# Patient Record
Sex: Male | Born: 1985 | Race: White | Hispanic: Yes | Marital: Married | Smoking: Never smoker
Health system: Southern US, Community
[De-identification: ages and names within clinical notes are randomized; demographics above are authoritative.]

## PROBLEM LIST (undated history)

## (undated) DIAGNOSIS — L209 Atopic dermatitis, unspecified: Secondary | ICD-10-CM

## (undated) HISTORY — DX: Atopic dermatitis, unspecified: L20.9

## (undated) HISTORY — PX: APPENDECTOMY: SHX54

---

## 2008-08-03 ENCOUNTER — Emergency Department (HOSPITAL_COMMUNITY): Admission: EM | Admit: 2008-08-03 | Discharge: 2008-08-04 | Payer: Self-pay | Admitting: Emergency Medicine

## 2008-11-09 ENCOUNTER — Emergency Department (HOSPITAL_COMMUNITY): Admission: EM | Admit: 2008-11-09 | Discharge: 2008-11-09 | Payer: Self-pay | Admitting: Emergency Medicine

## 2010-11-30 ENCOUNTER — Emergency Department (HOSPITAL_COMMUNITY)
Admission: EM | Admit: 2010-11-30 | Discharge: 2010-11-30 | Disposition: A | Discharge status: Home / Self Care | Payer: Self-pay | Attending: Emergency Medicine | Admitting: Emergency Medicine

## 2010-11-30 ENCOUNTER — Emergency Department (HOSPITAL_COMMUNITY): Payer: Self-pay

## 2010-11-30 DIAGNOSIS — L089 Local infection of the skin and subcutaneous tissue, unspecified: Secondary | ICD-10-CM | POA: Insufficient documentation

## 2010-11-30 DIAGNOSIS — M25569 Pain in unspecified knee: Secondary | ICD-10-CM | POA: Insufficient documentation

## 2010-11-30 DIAGNOSIS — M25469 Effusion, unspecified knee: Secondary | ICD-10-CM | POA: Insufficient documentation

## 2017-09-04 ENCOUNTER — Encounter (INDEPENDENT_AMBULATORY_CARE_PROVIDER_SITE_OTHER): Payer: Self-pay | Admitting: Nurse Practitioner

## 2017-09-04 ENCOUNTER — Other Ambulatory Visit: Payer: Self-pay

## 2017-09-04 ENCOUNTER — Ambulatory Visit (INDEPENDENT_AMBULATORY_CARE_PROVIDER_SITE_OTHER): Payer: Self-pay | Admitting: Nurse Practitioner

## 2017-09-04 VITALS — BP 134/89 | HR 94 | Temp 97.9°F | Ht 65.0 in | Wt 177.5 lb

## 2017-09-04 DIAGNOSIS — L290 Pruritus ani: Secondary | ICD-10-CM

## 2017-09-04 DIAGNOSIS — R1084 Generalized abdominal pain: Secondary | ICD-10-CM | POA: Insufficient documentation

## 2017-09-04 MED ORDER — HYDROCORTISONE 1 % EX CREA: 1.0000 "application " | TOPICAL_CREAM | Freq: Two times a day (BID) | CUTANEOUS | 1 refills | Status: DC

## 2017-09-04 MED ORDER — ZINC OXIDE 10 % EX CREA: 1.0000 "application " | TOPICAL_CREAM | Freq: Two times a day (BID) | CUTANEOUS | 1 refills | Status: DC

## 2017-09-04 NOTE — Progress Notes (Signed)
Assessment & Plan:  Theodore May was seen today for new patient (initial visit).  Diagnoses and all orders for this visit:  Perianal itch -     hydrocortisone cream 1 %; Apply 1 application topically 2 (two) times daily. -     ZINC OXIDE, TOPICAL, 10 % CREA; Apply 1 application topically 2 (two) times daily. Handout given on perianal care.    Patient has been counseled on age-appropriate routine health concerns for screening and prevention. These are reviewed and up-to-date. Referrals have been placed accordingly. Immunizations are up-to-date or declined.    Subjective:   Chief Complaint  Patient presents with  . New Patient (Initial Visit)    pain in stomach and anus when using the bathroom    HPI Theodore May 32 y.o. male presents to office today to establish care. VRI was used to communicate directly with patient for the entire encounter including providing detailed patient instructions.   Perianal Itching Endorses anal itching after having bowel movements and also when he sweats he experiences itching in his rectum. He also has noticed a small amount of blood on the toilet paper after having a bowel movement. He denies constipation, melena or hematochezia.  Stools are normal. He has not tried any creams. Onset 3 weeks ago. He denies any abdominal pain.    Review of Systems  Constitutional: Negative for fever, malaise/fatigue and weight loss.  Eyes: Negative.  Negative for blurred vision, double vision and photophobia.  Respiratory: Negative.  Negative for cough and shortness of breath.   Cardiovascular: Negative.  Negative for chest pain, palpitations and leg swelling.  Gastrointestinal: Negative for abdominal pain, blood in stool, constipation, diarrhea, heartburn, melena, nausea and vomiting.       SEE HPI  Skin: Positive for itching.       History of atopic dermatitis  Neurological: Negative.  Negative for dizziness, focal weakness, seizures and headaches.   Psychiatric/Behavioral: Negative.  Negative for suicidal ideas.    Past Medical History:  Diagnosis Date  . Atopic dermatitis     Past Surgical History:  Procedure Laterality Date  . APPENDECTOMY      Family History  Problem Relation Age of Onset  . Diabetes Mother   . Diabetes Father     Social History Reviewed with no changes to be made today.   No outpatient medications prior to visit.   No facility-administered medications prior to visit.     Not on File     Objective:    BP 134/89 (BP Location: Left Arm, Patient Position: Sitting, Cuff Size: Normal)   Pulse 94   Temp 97.9 F (36.6 C) (Oral)   Ht 5\' 5"  (1.651 m)   Wt 177 lb 8 oz (80.5 kg)   SpO2 95%   BMI 29.54 kg/m  Wt Readings from Last 3 Encounters:  09/04/17 177 lb 8 oz (80.5 kg)    Physical Exam  Constitutional: He is oriented to person, place, and time. He appears well-developed and well-nourished. He is cooperative.  HENT:  Head: Normocephalic and atraumatic.  Cardiovascular: Normal rate, regular rhythm, normal heart sounds and intact distal pulses. Exam reveals no gallop and no friction rub.  No murmur heard. Pulmonary/Chest: Effort normal and breath sounds normal. No stridor. No tachypnea. No respiratory distress. He has no decreased breath sounds. He has no wheezes. He has no rhonchi. He has no rales. He exhibits no tenderness.  Abdominal: Soft. Bowel sounds are normal. He exhibits no distension and no mass. There  is no tenderness. There is no rebound and no guarding.  Genitourinary: Rectum normal and penis normal. Rectal exam shows no external hemorrhoid, no fissure, no mass, no tenderness and anal tone normal.  Musculoskeletal: Normal range of motion. He exhibits no edema.  Neurological: He is alert and oriented to person, place, and time. Coordination normal.  Skin: Skin is warm and dry.  Psychiatric: He has a normal mood and affect. His behavior is normal. Judgment and thought content  normal.  Nursing note and vitals reviewed.      Patient has been counseled extensively about nutrition and exercise as well as the importance of adherence with medications and regular follow-up. The patient was given clear instructions to go to ER or return to medical center if symptoms don't improve, worsen or new problems develop. The patient verbalized understanding.   Follow-up: Return in about 3 weeks (around 09/25/2017) for f/u perianal itching.   Claiborne RiggZelda W Haiden Rawlinson, FNP-BC [facility] [place], [place] [phone]   09/04/2017, 2:52 PM

## 2017-09-04 NOTE — Patient Instructions (Addendum)
  Prurito anal (Anal Pruritus) El prurito anal ocurre cuando siente picazn en la zona que rodea el ano. Esta sensacin de picazn suele aparecer cuando la zona est hmeda. La humedad puede deberse a sudor o a una pequea cantidad de materia fecal (heces) que queda en la zona. Si se rasca la zona afectada puede daarla ms. CUIDADOS EN EL HOGAR Est atento a cualquier cambio en la afeccin. Tome estas medidas para Technical sales engineeraliviar la picazn: Cuidado de la piel  Mantenga la higiene lavndose bien el cuerpo. ? Limpie la zona afectada con papel higinico, toallitas para bebs o un pao hmedo. Hgalo despus de defecar y al acostarse. ? No use jabones en la zona. ? Seque bien la zona. Squela dando golpecitos.  No frote la zona con ningn elemento, ni siquiera con papel higinico.  No se rasque la zona que le pica, esto puede intensificar la picazn.  Dese un bao de agua tibia (bao de asiento) como se lo haya indicado el mdico. Seque la zona con un pao suave, dando golpecitos.  Use cremas o ungentos como se lo haya indicado el mdico.  No tome baos de burbujas, ni use papel higinico perfumado o desodorantes ntimos. Instrucciones generales  [employer] los medicamentos de venta libre y los recetados solamente como se lo haya indicado el mdico.  Hable con el mdico sobre los comprimidos de fibra (suplementos).  Use ropa interior de algodn y prendas sueltas.  Concurra a todas las visitas de control como se lo haya indicado el mdico. Esto es importante. SOLICITE AYUDA SI:  La picazn no se alivia despus de Rite Aidmuchos das.  La picazn aumenta.  Tiene fiebre.  La zona que le pica est enrojecida, se le hincha o le duele.  Observa que emana lquido, sangre o pus de la zona que le pica. Esta informacin no tiene Theme park managercomo fin reemplazar el consejo del mdico. Asegrese de hacerle al mdico cualquier pregunta que tenga. Document Released: 01/16/2011 Document Revised: 01/25/2015 Document Reviewed:  08/01/2014 Elsevier Interactive Patient Education  [employer]2018 Elsevier Inc.

## 2017-10-01 ENCOUNTER — Ambulatory Visit (INDEPENDENT_AMBULATORY_CARE_PROVIDER_SITE_OTHER): Payer: Self-pay | Admitting: Nurse Practitioner

## 2017-10-29 ENCOUNTER — Other Ambulatory Visit: Payer: Self-pay

## 2017-10-29 ENCOUNTER — Ambulatory Visit (INDEPENDENT_AMBULATORY_CARE_PROVIDER_SITE_OTHER): Payer: Self-pay | Admitting: Physician Assistant

## 2017-10-29 ENCOUNTER — Encounter (INDEPENDENT_AMBULATORY_CARE_PROVIDER_SITE_OTHER): Payer: Self-pay | Admitting: Physician Assistant

## 2017-10-29 VITALS — BP 127/74 | HR 67 | Temp 97.9°F | Ht 65.0 in | Wt 176.2 lb

## 2017-10-29 DIAGNOSIS — Z23 Encounter for immunization: Secondary | ICD-10-CM

## 2017-10-29 DIAGNOSIS — Z0000 Encounter for general adult medical examination without abnormal findings: Secondary | ICD-10-CM

## 2017-10-29 DIAGNOSIS — Z114 Encounter for screening for human immunodeficiency virus [HIV]: Secondary | ICD-10-CM

## 2017-10-29 NOTE — Patient Instructions (Signed)
Vacuna Td (contra la difteria y el ttanos): Lo que debe saber (Td Vaccine [Tetanus and Diphtheria]: What You Need to Know) 1. Por qu vacunarse? El ttanos y la difteria son enfermedades muy graves. Son poco frecuentes en los [place] actualmente, pero las personas que se infectan suelen tener complicaciones graves. La vacuna Td se usa para proteger a los adolescentes y a los adultos de ambas enfermedades. Tanto el ttanos como la difteria son infecciones causadas por bacterias. La difteria se transmite de persona a persona a travs de la tos o el estornudo. La bacteria que causa el ttanos entra al cuerpo a travs de cortes, raspones o heridas. El TTANOS (trismo) provoca entumecimiento y contraccin dolorosa de los msculos, por lo general, en todo el cuerpo.  Puede causar el endurecimiento de los msculos de la cabeza y el cuello, de modo que impide abrir la boca, tragar y en algunos casos, respirar. El ttanos es causa de muerte en aproximadamente 1de cada 10personas que contraen la infeccin, incluso despus de que reciben la mejor atencin mdica. La DIFTERIA puede hacer que se forme una membrana gruesa en la parte posterior de la garganta.  Puede causar problemas respiratorios, parlisis, insuficiencia cardaca e incluso la muerte. Antes de las vacunas, en los [place] se informaban 200000 casos de difteria y cientos de casos de ttanos cada ao. Desde que comenz la vacunacin, los informes de casos de ambas enfermedades se han reducido en un 99%. 2. Vacuna Td La vacuna Td protege a adolescentes y adultos contra el ttanos y la difteria. La vacuna Td habitualmente se aplica como dosis de refuerzo cada 10aos, pero tambin puede administrarse antes si la persona sufre una quemadura o herida sucia y grave. A veces, en lugar de la vacuna Td, se recomienda una vacuna llamada Tdap, que protege contra la tosferina, adems de proteger contra el ttanos y la difteria. El mdico o la  persona que le aplique la vacuna puede darle ms informacin al respecto. La Td puede administrarse de manera segura simultneamente con otras vacunas. 3. Algunas personas no deben recibir la vacuna  Una persona que alguna vez ha tenido una reaccin alrgica potencialmente mortal a una dosis anterior de cualquier vacuna contra el ttanos o la difteria, O que tenga una alergia grave a cualquier parte de esta vacuna, no debe recibir la vacuna Td. Informe a la persona que le aplica la vacuna si usted tiene cualquier alergia grave.  Consulte con su mdico si: ? tuvo hinchazn o dolor intenso despus de recibir cualquier vacuna contra la difteria o el ttanos, ? alguna vez ha sufrido el sndrome de Guillain-Barr, ? no se siente bien el da en que se ha programado la vacuna.  4. Riesgos de una reaccin a la vacuna Con cualquier medicamento, incluyendo las vacunas, existe la posibilidad de que aparezcan efectos secundarios. Suelen ser leves y desaparecen por s solos. Tambin son posibles las reacciones graves, pero en raras ocasiones. La mayora de las personas a las que se les aplica la vacuna Td no tienen ningn problema. Problemas leves despus de la vacuna Td: (No interfirieron en otras actividades)  Dolor en el lugar donde se aplic la vacuna (alrededor de 8de cada 10personas)  Enrojecimiento o hinchazn en el lugar donde se aplic la vacuna (alrededor de 1de cada 4personas)  Fiebre leve (poco frecuente)  Dolor de cabeza (alrededor de 1de cada 4personas)  Cansancio (alrededor de 1de cada 4personas) Problemas moderados despus de la vacuna Td: (Interfirieron en otras actividades,   pero no requirieron atencin mdica)  Fiebre superior a 102F (38,8C) (poco frecuente) Problemas graves despus de la vacuna Td: (Impidieron realizar las actividades habituales; requirieron atencin mdica)  Hinchazn, dolor intenso, sangrado o enrojecimiento en el brazo en que se aplic la vacuna  (poco frecuente). Problemas que podran ocurrir despus de cualquier vacuna:  Las personas a veces se desmayan despus de un procedimiento mdico, incluida la vacunacin. Si permanece sentado o recostado durante 15 minutos puede ayudar a evitar los desmayos y las lesiones causadas por las cadas. Informe al mdico si se siente mareado, tiene cambios en la visin o zumbidos en los odos.  Algunas personas sienten un dolor intenso en el hombro y tienen dificultad para mover el brazo donde se coloc la vacuna. Esto sucede con muy poca frecuencia.  Cualquier medicamento puede causar una reaccin alrgica grave. Dichas reacciones son muy poco frecuentes con una vacuna (se calcula que menos de 1en un milln de dosis) y se producen de unos minutos a unas horas despus de la administracin. Al igual que con cualquier medicamento, existe una probabilidad muy remota de que una vacuna cause una lesin grave o la muerte. Se controla permanentemente la seguridad de las vacunas. Para obtener ms informacin, visite: www.cdc.gov/vaccinesafety/. 5. Qu pasa si hay una reaccin grave? A qu signos debo estar atento?  Observe todo lo que le preocupe, como signos de una reaccin alrgica grave, fiebre muy alta o comportamiento fuera de lo normal. Los signos de una reaccin alrgica grave pueden incluir ronchas, hinchazn de la cara y la garganta, dificultad para respirar, latidos cardacos acelerados, mareos y debilidad. Generalmente, estos comenzaran entre unos pocos minutos y algunas horas despus de la vacunacin. Qu debo hacer?  Si usted piensa que se trata de una reaccin alrgica grave o de otra emergencia que no puede esperar, llame al 911 o dirjase al hospital ms cercano. Sino, llame a su mdico.  Despus, la reaccin debe informarse al Sistema de Informacin sobre Efectos Adversos de las Vacunas (Vaccine Adverse Event Reporting System, VAERS). Su mdico puede presentar este informe, o puede hacerlo  usted mismo a travs del sitio web de VAERS, en www.vaers.hhs.gov, o llamando al 1-800-822-7967. VAERS no brinda recomendaciones mdicas. 6. Programa Nacional de Compensacin de Daos por Vacunas El Programa Nacional de Compensacin de Daos por Vacunas (National Vaccine Injury Compensation Program, VICP) es un programa federal que fue creado para compensar a las personas que puedan haber sufrido daos al recibir ciertas vacunas. Aquellas personas que consideren que han sufrido un dao como consecuencia de una vacuna y quieran saber ms acerca del programa y de cmo presentar una denuncia, pueden llamar al 1-800-338-2382 o visitar su sitio web en www.hrsa.gov/vaccinecompensation. Hay un lmite de tiempo para presentar un reclamo de compensacin. 7. Cmo puedo obtener ms informacin?  Consulte a su mdico. Este puede darle el prospecto de la vacuna o recomendarle otras fuentes de informacin.  Comunquese con el servicio de salud de su localidad o su estado.  Comunquese con los Centros para el Control y la Prevencin de Enfermedades (Centers for Disease Control and Prevention , CDC). ? Llame al 1-800-232-4636 (1-800-CDC-INFO). ? Visite el sitio web de los CDC en www.cdc.gov/vaccines. Declaracin de informacin sobre la vacuna contra la difteria y el ttanos (Td) de los CDC (08/29/15) Esta informacin no tiene como fin reemplazar el consejo del mdico. Asegrese de hacerle al mdico cualquier pregunta que tenga. Document Released: 08/22/2008 Document Revised: 05/27/2014 Document Reviewed: 08/29/2015 Elsevier Interactive Patient Education  2017 Elsevier   Inc.  

## 2017-10-29 NOTE — Progress Notes (Signed)
   Subjective:  Patient ID: Theodore May, male    DOB: 1986/02/28  Age: 32 y.o. MRN: [id number]  CC: perianal itching  HPI Theodore May is a 32 y.o. male with a medical history of atopic dermatitis presents on f/u of perianal itching. Seen here two months ago and prescribed Hdyrocortisone 1% cream and zinc oxide 10% cream which reportedly have resolved the issue. Believes the itching May have been caused by heat and sweating. Pt interested in having a full physical done today. Does not have any complaints or symptoms. Would like blood work because he did have abnormal cholesterol levels previously.        Outpatient Medications Prior to Visit  Medication Sig Dispense Refill  . hydrocortisone cream 1 % Apply 1 application topically 2 (two) times daily. 45 g 1  . ZINC OXIDE, TOPICAL, 10 % CREA Apply 1 application topically 2 (two) times daily. 78 g 1   No facility-administered medications prior to visit.      ROS Review of Systems  Constitutional: Negative for chills, fever and malaise/fatigue.  Eyes: Negative for blurred vision.  Respiratory: Negative for shortness of breath.   Cardiovascular: Negative for chest pain and palpitations.  Gastrointestinal: Negative for abdominal pain and nausea.  Genitourinary: Negative for dysuria and hematuria.  Musculoskeletal: Negative for joint pain and myalgias.  Skin: Negative for rash.  Neurological: Negative for tingling and headaches.  Psychiatric/Behavioral: Negative for depression. The patient is not nervous/anxious.     Objective:  BP 127/74 (BP Location: Left Arm, Patient Position: Sitting, Cuff Size: Normal)   Pulse 67   Temp 97.9 F (36.6 C) (Oral)   Ht 5\' 5"  (1.651 m)   Wt 176 lb 3.2 oz (79.9 kg)   SpO2 97%   BMI 29.32 kg/m   BP/Weight 10/29/2017 09/04/2017  Systolic BP 127 134  Diastolic BP 74 89  Wt. (Lbs) 176.2 177.5  BMI 29.32 29.54      Physical Exam  Constitutional: He is oriented to person,  place, and time.  Well developed, well nourished, NAD, polite  HENT:  Head: Normocephalic and atraumatic.  Mouth/Throat: No oropharyngeal exudate.  Eyes: Pupils are equal, round, and reactive to light. Conjunctivae and EOM are normal. No scleral icterus.  Neck: Normal range of motion. Neck supple. No thyromegaly present.  Cardiovascular: Normal rate, regular rhythm and normal heart sounds. Exam reveals no gallop and no friction rub.  No murmur heard. Pulmonary/Chest: Effort normal and breath sounds normal.  Abdominal: Soft. Bowel sounds are normal. There is no tenderness.  Musculoskeletal: He exhibits no edema, tenderness or deformity.  Back and extremities with full aROM.  Neurological: He is alert and oriented to person, place, and time.  Patellar DTR 1+ bilaterally. Strength 5/5 throughout  Skin: Skin is warm and dry. No rash noted. No erythema. No pallor.  Psychiatric: He has a normal mood and affect. His behavior is normal. Thought content normal.  Vitals reviewed.    Assessment & Plan:    1. Annual physical exam - CBC with Differential - Comprehensive metabolic panel - Lipid panel  2. Screening for HIV (human immunodeficiency virus) - HIV antibody  3. Need for Tdap vaccination - Tdap vaccine greater than or equal to 7yo IM     Follow-up: Return if symptoms worsen or fail to improve.   Loletta Specteroger David Gomez PA

## 2017-10-30 LAB — CBC WITH DIFFERENTIAL/PLATELET
Basophils Absolute: 0 10*3/uL (ref 0.0–0.2)
Basos: 0 %
EOS (ABSOLUTE): 0.1 10*3/uL (ref 0.0–0.4)
EOS: 1 %
HEMATOCRIT: 51.4 % — AB (ref 37.5–51.0)
HEMOGLOBIN: 17.9 g/dL — AB (ref 13.0–17.7)
IMMATURE GRANS (ABS): 0 10*3/uL (ref 0.0–0.1)
IMMATURE GRANULOCYTES: 0 %
LYMPHS ABS: 1.8 10*3/uL (ref 0.7–3.1)
Lymphs: 32 %
MCH: 31.1 pg (ref 26.6–33.0)
MCHC: 34.8 g/dL (ref 31.5–35.7)
MCV: 89 fL (ref 79–97)
MONOCYTES: 10 %
Monocytes Absolute: 0.6 10*3/uL (ref 0.1–0.9)
Neutrophils Absolute: 3.2 10*3/uL (ref 1.4–7.0)
Neutrophils: 57 %
Platelets: 225 10*3/uL (ref 150–450)
RBC: 5.75 x10E6/uL (ref 4.14–5.80)
RDW: 13.6 % (ref 12.3–15.4)
WBC: 5.7 10*3/uL (ref 3.4–10.8)

## 2017-10-30 LAB — COMPREHENSIVE METABOLIC PANEL
ALBUMIN: 4.7 g/dL (ref 3.5–5.5)
ALT: 86 IU/L — ABNORMAL HIGH (ref 0–44)
AST: 45 IU/L — ABNORMAL HIGH (ref 0–40)
Albumin/Globulin Ratio: 1.7 (ref 1.2–2.2)
Alkaline Phosphatase: 60 IU/L (ref 39–117)
BUN / CREAT RATIO: 18 (ref 9–20)
BUN: 13 mg/dL (ref 6–20)
Bilirubin Total: 0.7 mg/dL (ref 0.0–1.2)
CALCIUM: 10 mg/dL (ref 8.7–10.2)
CO2: 24 mmol/L (ref 20–29)
CREATININE: 0.73 mg/dL — AB (ref 0.76–1.27)
Chloride: 100 mmol/L (ref 96–106)
GFR, EST AFRICAN AMERICAN: 143 mL/min/{1.73_m2} (ref >59)
GFR, EST NON AFRICAN AMERICAN: 124 mL/min/{1.73_m2} (ref >59)
Globulin, Total: 2.7 g/dL (ref 1.5–4.5)
Glucose: 98 mg/dL (ref 65–99)
Potassium: 4.1 mmol/L (ref 3.5–5.2)
SODIUM: 139 mmol/L (ref 134–144)
TOTAL PROTEIN: 7.4 g/dL (ref 6.0–8.5)

## 2017-10-30 LAB — LIPID PANEL
CHOL/HDL RATIO: 4.7 ratio (ref 0.0–5.0)
Cholesterol, Total: 177 mg/dL (ref 100–199)
HDL: 38 mg/dL — ABNORMAL LOW (ref >39)
LDL CALC: 110 mg/dL — AB (ref 0–99)
Triglycerides: 144 mg/dL (ref 0–149)
VLDL CHOLESTEROL CAL: 29 mg/dL (ref 5–40)

## 2017-10-30 LAB — HIV ANTIBODY (ROUTINE TESTING W REFLEX): HIV Screen 4th Generation wRfx: NONREACTIVE

## 2017-10-31 ENCOUNTER — Telehealth (INDEPENDENT_AMBULATORY_CARE_PROVIDER_SITE_OTHER): Payer: Self-pay

## 2017-10-31 NOTE — Telephone Encounter (Signed)
-----   Message from Loletta Specteroger David Gomez, PA-C sent at 10/30/2017  8:47 AM EDT ----- HIV negative, mild liver inflammation, mildly elevated cholesterol. I recommend he reduce his fatty/fried food intake. Watch sweet intake. I would be able to work up liver enzyme elevation at his next visit.

## 2017-10-31 NOTE — Telephone Encounter (Signed)
[employer] interpreter [place] [phone]) left message asking patient to call [facility] at [phone]. If patient returns call notify that HIV is negative. Mild liver inflammation, mildly elevated cholesterol. It is recommended that patient reduce his fatty/fried food intake, watch sweet intake. Will further work up liver elevation at next visit. Maryjean Mornempestt S Srinidhi Landers, CMA

## 2017-12-17 ENCOUNTER — Other Ambulatory Visit: Payer: Self-pay

## 2017-12-17 ENCOUNTER — Ambulatory Visit (INDEPENDENT_AMBULATORY_CARE_PROVIDER_SITE_OTHER): Payer: Self-pay | Admitting: Physician Assistant

## 2017-12-17 ENCOUNTER — Encounter (INDEPENDENT_AMBULATORY_CARE_PROVIDER_SITE_OTHER): Payer: Self-pay | Admitting: Physician Assistant

## 2017-12-17 ENCOUNTER — Other Ambulatory Visit (HOSPITAL_COMMUNITY)
Admission: RE | Admit: 2017-12-17 | Discharge: 2017-12-17 | Disposition: A | Discharge status: Home / Self Care | Payer: Self-pay | Source: Ambulatory Visit | Attending: Physician Assistant | Admitting: Physician Assistant

## 2017-12-17 VITALS — BP 113/75 | HR 70 | Temp 98.1°F | Ht 65.0 in | Wt 178.4 lb

## 2017-12-17 DIAGNOSIS — Z131 Encounter for screening for diabetes mellitus: Secondary | ICD-10-CM

## 2017-12-17 DIAGNOSIS — G8929 Other chronic pain: Secondary | ICD-10-CM

## 2017-12-17 DIAGNOSIS — M545 Low back pain: Secondary | ICD-10-CM

## 2017-12-17 DIAGNOSIS — Z202 Contact with and (suspected) exposure to infections with a predominantly sexual mode of transmission: Secondary | ICD-10-CM

## 2017-12-17 DIAGNOSIS — R350 Frequency of micturition: Secondary | ICD-10-CM | POA: Insufficient documentation

## 2017-12-17 DIAGNOSIS — R748 Abnormal levels of other serum enzymes: Secondary | ICD-10-CM

## 2017-12-17 LAB — POCT GLYCOSYLATED HEMOGLOBIN (HGB A1C): Hemoglobin A1C: 5.3 % (ref 4.0–5.6)

## 2017-12-17 LAB — POCT URINALYSIS DIPSTICK
BILIRUBIN UA: NEGATIVE
Blood, UA: NEGATIVE
GLUCOSE UA: NEGATIVE
Ketones, UA: NEGATIVE
Leukocytes, UA: NEGATIVE
Nitrite, UA: NEGATIVE
Protein, UA: NEGATIVE
Spec Grav, UA: 1.025 (ref 1.010–1.025)
Urobilinogen, UA: 0.2 E.U./dL
pH, UA: 5.5 (ref 5.0–8.0)

## 2017-12-17 MED ORDER — CYCLOBENZAPRINE HCL 10 MG PO TABS: 10.0000 mg | ORAL_TABLET | Freq: Every day | ORAL | 0 refills | Status: DC

## 2017-12-17 MED ORDER — NAPROXEN 500 MG PO TABS: 500.0000 mg | ORAL_TABLET | Freq: Two times a day (BID) | ORAL | 0 refills | Status: DC

## 2017-12-17 NOTE — Patient Instructions (Signed)

## 2017-12-17 NOTE — Progress Notes (Signed)
Subjective:  Patient ID: Theodore May, male    DOB: [birth year]  Age: 32 y.o. MRN: [id number]  CC: abdominal pain left side  HPI  Theodore May is a 32 y.o. male with a medical history of atopic dermatitis presents with abdominal pain on the left side. Thinks his liver is in the LUQ and is now hurting after hearing of mildly elevated liver enzymes. Says he also has bilateral lower back pain. Attributed to driving long hours. Has associated urinary frequency. Admits to having protected sexual intercourse outside of his marriage with a male friend. Does not endorse f/c/n/v, rash, genital lesions, or penile drainage.     Pt here to f/u on elevated liver enzymes. AST 45 and ALT 86. Does not drink alcohol. States he has also changed diet to include less fatty foods due to his elevated LDL of 110.          Outpatient Medications Prior to Visit  Medication Sig Dispense Refill  . hydrocortisone cream 1 % Apply 1 application topically 2 (two) times daily. 45 g 1  . ZINC OXIDE, TOPICAL, 10 % CREA Apply 1 application topically 2 (two) times daily. 78 g 1   No facility-administered medications prior to visit.      ROS Review of Systems  Constitutional: Negative for chills, fever and malaise/fatigue.  Eyes: Negative for blurred vision.  Respiratory: Negative for shortness of breath.   Cardiovascular: Negative for chest pain and palpitations.  Gastrointestinal: Positive for abdominal pain (LUQ). Negative for nausea.  Genitourinary: Negative for dysuria and hematuria.  Musculoskeletal: Positive for back pain. Negative for joint pain and myalgias.  Skin: Negative for rash.  Neurological: Negative for tingling and headaches.  Psychiatric/Behavioral: Negative for depression. The patient is not nervous/anxious.     Objective:  BP 113/75 (BP Location: Left Arm, Patient Position: Sitting, Cuff Size: Normal)   Pulse 70   Temp 98.1 F (36.7 C) (Oral)   Ht 5\' 5"  (1.651 m)   Wt  178 lb 6.4 oz (80.9 kg)   SpO2 94%   BMI 29.69 kg/m   BP/Weight 12/17/2017 10/29/2017 09/04/2017  Systolic BP 113 127 134  Diastolic BP 75 74 89  Wt. (Lbs) 178.4 176.2 177.5  BMI 29.69 29.32 29.54      Physical Exam  Constitutional: He is oriented to person, place, and time.  Well developed, well nourished, NAD, polite  HENT:  Head: Normocephalic and atraumatic.  Eyes: No scleral icterus.  Neck: Normal range of motion. Neck supple. No thyromegaly present.  Cardiovascular: Normal rate, regular rhythm and normal heart sounds.  Pulmonary/Chest: Effort normal and breath sounds normal.  Abdominal: Soft. Bowel sounds are normal. He exhibits no distension and no mass. There is tenderness (mild TTP of the LUQ). There is no rebound and no guarding.  Genitourinary:  Genitourinary Comments: deferred  Musculoskeletal: He exhibits no edema.  Increased muscular tonicity of the lower thoracic and lumbar paraspinals  Neurological: He is alert and oriented to person, place, and time.  Skin: Skin is warm and dry. No rash noted. No erythema. No pallor.  Psychiatric: He has a normal mood and affect. His behavior is normal. Thought content normal.  Vitals reviewed.    Assessment & Plan:   1. Chronic bilateral low back pain without sciatica - Begin cyclobenzaprine (FLEXERIL) 10 MG tablet; Take 1 tablet (10 mg total) by mouth at bedtime.  Dispense: 10 tablet; Refill: 0 - Begin naproxen (NAPROSYN) 500 MG tablet; Take 1 tablet (500  mg total) by mouth 2 (two) times daily with a meal.  Dispense: 30 tablet; Refill: 0  2. Elevated liver enzymes - Hepatic Function Panel - Hepatitis panel, acute - US Abdomen Limited RUQ; Future  3. Screening for diabetes mellitus - HgB A1c 5.3%  4. Urinary frequency - Urinalysis Dipstick negative - Urine cytology ancillary only - HgB A1c 5.3%  5. Possible exposure to STD - Urine cytology ancillary only - HIV antibody   Meds ordered this encounter   Medications  . cyclobenzaprine (FLEXERIL) 10 MG tablet    Sig: Take 1 tablet (10 mg total) by mouth at bedtime.    Dispense:  10 tablet    Refill:  0    Order Specific Question:   Supervising Provider    Answer:   Hoy RegisterNEWLIN, ENOBONG [[id number]]  . naproxen (NAPROSYN) 500 MG tablet    Sig: Take 1 tablet (500 mg total) by mouth 2 (two) times daily with a meal.    Dispense:  30 tablet    Refill:  0    Order Specific Question:   Supervising Provider    Answer:   Hoy RegisterNEWLIN, ENOBONG [[id number]]    Follow-up: Return in about 4 months (around 04/18/2018) for LFTs.   Loletta Specteroger David Steffani Dionisio PA

## 2017-12-18 LAB — HEPATITIS PANEL, ACUTE
HEP A IGM: NEGATIVE
Hep B C IgM: NEGATIVE
Hep C Virus Ab: <0.1 s/co ratio (ref 0.0–0.9)
Hepatitis B Surface Ag: NEGATIVE

## 2017-12-18 LAB — HEPATIC FUNCTION PANEL
ALK PHOS: 60 IU/L (ref 39–117)
ALT: 115 IU/L — ABNORMAL HIGH (ref 0–44)
AST: 47 IU/L — ABNORMAL HIGH (ref 0–40)
Albumin: 4.8 g/dL (ref 3.5–5.5)
BILIRUBIN TOTAL: 0.7 mg/dL (ref 0.0–1.2)
BILIRUBIN, DIRECT: 0.16 mg/dL (ref 0.00–0.40)
Total Protein: 7.3 g/dL (ref 6.0–8.5)

## 2017-12-18 LAB — HIV ANTIBODY (ROUTINE TESTING W REFLEX): HIV Screen 4th Generation wRfx: NONREACTIVE

## 2017-12-19 ENCOUNTER — Ambulatory Visit (HOSPITAL_COMMUNITY)
Admission: RE | Admit: 2017-12-19 | Discharge: 2017-12-19 | Disposition: A | Discharge status: Home / Self Care | Payer: Self-pay | Source: Ambulatory Visit | Attending: Physician Assistant | Admitting: Physician Assistant

## 2017-12-19 ENCOUNTER — Telehealth (INDEPENDENT_AMBULATORY_CARE_PROVIDER_SITE_OTHER): Payer: Self-pay

## 2017-12-19 DIAGNOSIS — R748 Abnormal levels of other serum enzymes: Secondary | ICD-10-CM | POA: Insufficient documentation

## 2017-12-19 DIAGNOSIS — K760 Fatty (change of) liver, not elsewhere classified: Secondary | ICD-10-CM | POA: Insufficient documentation

## 2017-12-19 LAB — URINE CYTOLOGY ANCILLARY ONLY
Chlamydia: NEGATIVE
NEISSERIA GONORRHEA: NEGATIVE
TRICH (WINDOWPATH): NEGATIVE

## 2017-12-19 NOTE — Telephone Encounter (Signed)
Call placed using pacific interpreter [phone]. Patient does not have voicemail setup. Will call once more before mailing results. Maryjean Mornempestt S Roberts, CMA

## 2017-12-19 NOTE — Telephone Encounter (Signed)
-----   Message from Loletta Specteroger David Gomez, PA-C sent at 12/18/2017  8:33 AM EDT ----- Liver inflammation mildly higher than previous. Hepatitis negative and HIV negative. Awaiting RUQ US report. I suspect fatty liver, advise to avoid fatty/fried foods.

## 2017-12-26 ENCOUNTER — Telehealth (INDEPENDENT_AMBULATORY_CARE_PROVIDER_SITE_OTHER): Payer: Self-pay

## 2017-12-26 ENCOUNTER — Encounter (INDEPENDENT_AMBULATORY_CARE_PROVIDER_SITE_OTHER): Payer: Self-pay

## 2017-12-26 NOTE — Telephone Encounter (Signed)
Call placed using pacific interpreter [phone] patient voicemail still not setup. All results have been mailed. Maryjean Mornempestt S Roberts, CMA

## 2017-12-26 NOTE — Telephone Encounter (Signed)
-----   Message from Loletta Specteroger David Gomez, PA-C sent at 12/22/2017  1:40 PM EDT ----- US reveals fatty liver.

## 2018-04-15 ENCOUNTER — Ambulatory Visit (INDEPENDENT_AMBULATORY_CARE_PROVIDER_SITE_OTHER): Payer: Self-pay | Admitting: Physician Assistant

## 2018-05-01 ENCOUNTER — Other Ambulatory Visit: Payer: Self-pay

## 2018-05-01 ENCOUNTER — Ambulatory Visit (INDEPENDENT_AMBULATORY_CARE_PROVIDER_SITE_OTHER): Payer: Self-pay | Admitting: Physician Assistant

## 2018-05-01 ENCOUNTER — Encounter (INDEPENDENT_AMBULATORY_CARE_PROVIDER_SITE_OTHER): Payer: Self-pay | Admitting: Physician Assistant

## 2018-05-01 VITALS — BP 117/80 | HR 68 | Temp 97.7°F | Ht 65.0 in | Wt 185.2 lb

## 2018-05-01 DIAGNOSIS — R748 Abnormal levels of other serum enzymes: Secondary | ICD-10-CM | POA: Insufficient documentation

## 2018-05-01 DIAGNOSIS — E7841 Elevated Lipoprotein(a): Secondary | ICD-10-CM | POA: Insufficient documentation

## 2018-05-01 NOTE — Progress Notes (Signed)
Subjective:  Patient ID: Theodore May, male    DOB: 1986-01-06  Age: 32 y.o. MRN: [id number]  CC: f/u LFTs   HPI Theodore May a 32 y.o.malewith amedical history of atopic dermatitis presents to f/u on LFTs. Last AST 47 and ALT 115. Negative acute hepatitis panel. Abdominal US revealed fatty liver. Lipid panel revealed LDL 110 and triglycerides 409. Says he has tried adjusting diet to consume less fatty foods. Pt states he is feeling abdominal bloating and has much gas. Attributed to eating chinese food. Had an episode of nausea yesterday attributed to eating a late lunch. However, feels better in regards to LUQ pain. Does not endorse CP, palpitations, SOB, HA, abdominal pain, f/c/n/v, rash, jaundice, or GU sxs.       Outpatient Medications Prior to Visit  Medication Sig Dispense Refill  . hydrocortisone cream 1 % Apply 1 application topically 2 (two) times daily. (Patient not taking: Reported on 05/01/2018) 45 g 1  . ZINC OXIDE, TOPICAL, 10 % CREA Apply 1 application topically 2 (two) times daily. (Patient not taking: Reported on 05/01/2018) 78 g 1  . cyclobenzaprine (FLEXERIL) 10 MG tablet Take 1 tablet (10 mg total) by mouth at bedtime. 10 tablet 0  . naproxen (NAPROSYN) 500 MG tablet Take 1 tablet (500 mg total) by mouth 2 (two) times daily with a meal. 30 tablet 0   No facility-administered medications prior to visit.      ROS Review of Systems  Constitutional: Negative for chills, fever and malaise/fatigue.  Eyes: Negative for blurred vision.  Respiratory: Negative for shortness of breath.   Cardiovascular: Negative for chest pain and palpitations.  Gastrointestinal: Positive for nausea. Negative for abdominal pain, blood in stool, constipation, diarrhea, melena and vomiting.       Bloating  Genitourinary: Negative for dysuria and hematuria.  Musculoskeletal: Negative for joint pain and myalgias.  Skin: Negative for rash.  Neurological: Negative for  tingling and headaches.  Psychiatric/Behavioral: Negative for depression. The patient is not nervous/anxious.     Objective:  Ht 5\' 5"  (1.651 m)   Wt 185 lb 3.2 oz (84 kg)   BMI 30.82 kg/m   Vitals:   05/01/18 0838  BP: 117/80  Pulse: 68  Temp: 97.7 F (36.5 C)  TempSrc: Oral  SpO2: 96%  Weight: 185 lb 3.2 oz (84 kg)  Height: 5\' 5"  (1.651 m)     Physical Exam Vitals signs reviewed.  Constitutional:      Comments: Well developed, well nourished, NAD, polite  HENT:     Head: Normocephalic and atraumatic.  Eyes:     General: No scleral icterus. Neck:     Musculoskeletal: Normal range of motion and neck supple.     Thyroid: No thyromegaly.  Cardiovascular:     Rate and Rhythm: Normal rate and regular rhythm.     Heart sounds: Normal heart sounds.  Pulmonary:     Effort: Pulmonary effort is normal.     Breath sounds: Normal breath sounds.  Abdominal:     General: Bowel sounds are normal.     Palpations: Abdomen is soft.     Tenderness: There is abdominal tenderness (Mild epigastric TTP).  Skin:    General: Skin is warm and dry.     Coloration: Skin is not pale.     Findings: No erythema or rash.  Neurological:     Mental Status: He is alert and oriented to person, place, and time.  Psychiatric:  Mood and Affect: Mood normal.        Behavior: Behavior normal.        Thought Content: Thought content normal.     Comments: Somewhat anxious      Assessment & Plan:   1. Elevated liver enzymes - Hepatic Function Panel  2. Elevated lipoprotein(a) - Lipid panel    Follow-up: Will call with results.  Loletta Specteroger David Candy Ziegler PA

## 2018-05-02 LAB — HEPATIC FUNCTION PANEL
ALBUMIN: 4.6 g/dL (ref 3.5–5.5)
ALT: 86 IU/L — AB (ref 0–44)
AST: 30 IU/L (ref 0–40)
Alkaline Phosphatase: 70 IU/L (ref 39–117)
BILIRUBIN TOTAL: 0.5 mg/dL (ref 0.0–1.2)
Bilirubin, Direct: 0.12 mg/dL (ref 0.00–0.40)
Total Protein: 6.8 g/dL (ref 6.0–8.5)

## 2018-05-02 LAB — LIPID PANEL
CHOL/HDL RATIO: 4.7 ratio (ref 0.0–5.0)
Cholesterol, Total: 164 mg/dL (ref 100–199)
HDL: 35 mg/dL — AB (ref >39)
LDL Calculated: 107 mg/dL — ABNORMAL HIGH (ref 0–99)
TRIGLYCERIDES: 111 mg/dL (ref 0–149)
VLDL CHOLESTEROL CAL: 22 mg/dL (ref 5–40)

## 2018-05-04 ENCOUNTER — Other Ambulatory Visit (INDEPENDENT_AMBULATORY_CARE_PROVIDER_SITE_OTHER): Payer: Self-pay | Admitting: Physician Assistant

## 2018-05-04 DIAGNOSIS — E7841 Elevated Lipoprotein(a): Secondary | ICD-10-CM

## 2018-05-04 MED ORDER — PRAVASTATIN SODIUM 20 MG PO TABS: 20.0000 mg | ORAL_TABLET | Freq: Every day | ORAL | 3 refills | Status: DC

## 2018-05-07 ENCOUNTER — Telehealth (INDEPENDENT_AMBULATORY_CARE_PROVIDER_SITE_OTHER): Payer: Self-pay

## 2018-05-07 NOTE — Telephone Encounter (Signed)
Call placed using pacific interpreter [name]([phone]) patient is aware that Liver enzymes are better than last time but still mildly elevated. Cholesterol is slightly lower than previous. I recommend he take a low dose statin and abstain from greasy/fatty foods. I have sent statin to [facility] on [address] Maryjean Mornempestt S Alaynna Kerwood, CMA

## 2018-08-08 ENCOUNTER — Ambulatory Visit (HOSPITAL_COMMUNITY)
Admission: EM | Admit: 2018-08-08 | Discharge: 2018-08-08 | Disposition: A | Discharge status: Home / Self Care | Payer: Self-pay | Attending: Physician Assistant | Admitting: Physician Assistant

## 2018-08-08 ENCOUNTER — Encounter (HOSPITAL_COMMUNITY): Payer: Self-pay

## 2018-08-08 DIAGNOSIS — G44209 Tension-type headache, unspecified, not intractable: Secondary | ICD-10-CM

## 2018-08-08 MED ORDER — KETOROLAC TROMETHAMINE 60 MG/2ML IM SOLN
INTRAMUSCULAR | Status: AC
  Filled 2018-08-08: qty 2

## 2018-08-08 MED ORDER — KETOROLAC TROMETHAMINE 60 MG/2ML IM SOLN
60.0000 mg | Freq: Once | INTRAMUSCULAR | Status: AC
  Administered 2018-08-08: 60 mg via INTRAMUSCULAR

## 2018-08-08 MED ORDER — NAPROXEN 500 MG PO TABS: 500.0000 mg | ORAL_TABLET | Freq: Two times a day (BID) | ORAL | 0 refills | Status: DC

## 2018-08-08 NOTE — Discharge Instructions (Signed)
Please start the prescription medicine twice a day. You can take your first dose tonight.

## 2018-08-08 NOTE — ED Triage Notes (Signed)
Pt present migraine that started 3 days ago. Pt states the 1st day the headache was mild but over the past few days the headache has gotten worst.

## 2018-08-08 NOTE — ED Provider Notes (Signed)
08/08/2018 3:41 PM   DOB: Mar 15, 1986 / MRN: [id number]  SUBJECTIVE:  Theodore May is a 33 y.o. male presenting for mild worsening generalized HA that started 3 ago.  Assoicates no other sympotms.  Denies vision changes.   He has No Known Allergies.   He  has a past medical history of Atopic dermatitis.    He  reports that he has never smoked. He has never used smokeless tobacco. He reports that he does not drink alcohol or use drugs. He  reports being sexually active and has had partner(s) who are Male. The patient  has a past surgical history that includes Appendectomy.  His family history includes Diabetes in his father and mother.  Review of Systems  Neurological: Negative for dizziness, tingling, tremors, sensory change, speech change, focal weakness, seizures, loss of consciousness and weakness.    OBJECTIVE:  BP 121/81 (BP Location: Left Arm)   Pulse (!) 107   Temp 98.9 F (37.2 C) (Oral)   Resp 16   SpO2 96%   Wt Readings from Last 3 Encounters:  05/01/18 185 lb 3.2 oz (84 kg)  12/17/17 178 lb 6.4 oz (80.9 kg)  10/29/17 176 lb 3.2 oz (79.9 kg)   Temp Readings from Last 3 Encounters:  08/08/18 98.9 F (37.2 C) (Oral)  05/01/18 97.7 F (36.5 C) (Oral)  12/17/17 98.1 F (36.7 C) (Oral)   BP Readings from Last 3 Encounters:  08/08/18 121/81  05/01/18 117/80  12/17/17 113/75   Pulse Readings from Last 3 Encounters:  08/08/18 (!) 107  05/01/18 68  12/17/17 70    Physical Exam Vitals signs and nursing note reviewed.  Constitutional:      Appearance: He is well-developed. He is not ill-appearing or diaphoretic.  Eyes:     Conjunctiva/sclera: Conjunctivae normal.     Pupils: Pupils are equal, round, and reactive to light.  Cardiovascular:     Rate and Rhythm: Normal rate.  Pulmonary:     Effort: Pulmonary effort is normal.  Abdominal:     General: There is no distension.  Musculoskeletal: Normal range of motion.  Skin:    General: Skin is warm  and dry.  Neurological:     General: No focal deficit present.     Mental Status: He is alert and oriented to person, place, and time.     GCS: GCS eye subscore is 4. GCS verbal subscore is 5. GCS motor subscore is 6.     Cranial Nerves: No cranial nerve deficit, dysarthria or facial asymmetry.     Motor: No tremor or pronator drift.     Coordination: Coordination normal.     Gait: Gait normal.     No results found for this or any previous visit (from the past 72 hour(s)).  No results found.  ASSESSMENT AND PLAN:   Acute non intractable tension-type headache: No red flags.     Discharge Instructions     Please start the prescription medicine twice a day. You can take your first dose tonight.         The patient is advised to call or return to clinic if he does not see an improvement in symptoms, or to seek the care of the closest emergency department if he worsens with the above plan.   Deliah Boston, MHS, PA-C 08/08/2018 3:41 PM   Ofilia Neas, PA-C 08/08/18 1541

## 2019-03-11 ENCOUNTER — Encounter (HOSPITAL_COMMUNITY): Payer: Self-pay | Admitting: Emergency Medicine

## 2019-03-11 ENCOUNTER — Encounter (INDEPENDENT_AMBULATORY_CARE_PROVIDER_SITE_OTHER): Payer: Self-pay | Admitting: Primary Care

## 2019-03-11 ENCOUNTER — Emergency Department (HOSPITAL_COMMUNITY)
Admission: EM | Admit: 2019-03-11 | Discharge: 2019-03-12 | Disposition: A | Discharge status: Home / Self Care | Payer: Self-pay | Attending: Emergency Medicine | Admitting: Emergency Medicine

## 2019-03-11 ENCOUNTER — Telehealth (INDEPENDENT_AMBULATORY_CARE_PROVIDER_SITE_OTHER): Payer: Self-pay | Admitting: Primary Care

## 2019-03-11 ENCOUNTER — Other Ambulatory Visit: Payer: Self-pay

## 2019-03-11 ENCOUNTER — Emergency Department (HOSPITAL_COMMUNITY): Payer: Self-pay

## 2019-03-11 DIAGNOSIS — G44209 Tension-type headache, unspecified, not intractable: Secondary | ICD-10-CM

## 2019-03-11 DIAGNOSIS — Z5321 Procedure and treatment not carried out due to patient leaving prior to being seen by health care provider: Secondary | ICD-10-CM | POA: Insufficient documentation

## 2019-03-11 DIAGNOSIS — G44319 Acute post-traumatic headache, not intractable: Secondary | ICD-10-CM

## 2019-03-11 DIAGNOSIS — R519 Headache, unspecified: Secondary | ICD-10-CM | POA: Insufficient documentation
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
## 2019-03-12 ENCOUNTER — Telehealth (INDEPENDENT_AMBULATORY_CARE_PROVIDER_SITE_OTHER): Payer: Self-pay

## 2019-03-12 ENCOUNTER — Encounter (INDEPENDENT_AMBULATORY_CARE_PROVIDER_SITE_OTHER): Payer: Self-pay | Admitting: Primary Care

## 2019-03-12 ENCOUNTER — Other Ambulatory Visit (INDEPENDENT_AMBULATORY_CARE_PROVIDER_SITE_OTHER): Payer: Self-pay | Admitting: Primary Care

## 2019-03-12 MED ORDER — AMOXICILLIN-POT CLAVULANATE 875-125 MG PO TABS: 1.0000 | ORAL_TABLET | Freq: Two times a day (BID) | ORAL | 0 refills | Status: DC

## 2019-03-12 NOTE — ED Notes (Signed)
PT stated he was going to go ahead and go home since he has waited so long. Patient highly encouraged to stay, however patient declined. Patient highly encouraged to come back if worsening symptoms.

## 2019-03-12 NOTE — Telephone Encounter (Signed)
FWD to PCP. Tempestt S Roberts, CMA  

## 2019-03-12 NOTE — Telephone Encounter (Signed)
Patient called to inform that he went yesterday to get his CT scan but was advice that he needed to set up an appointment first. Patient decided to go to the ED and was able to obtain a CT scan. Patient states that he did not get the results for his CT scan and will like to know if PCP can give him the results.  Please advice [phone]  Thank you Whitney Post

## 2019-03-12 NOTE — Telephone Encounter (Signed)
interpretor Elmo Putt # [id number] Rhae Hammock called stating he was not given the results of his CT scan  IMPRESSION: 1. No acute intracranial abnormalities. 2. 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 This is likely chronic, as similar findings were seen in 2010, however, clinical correlation for signs and symptoms of sinusitis is recommended.   Advise to purchase over the antihistamine and send in antibiotic for sinuusitis

## 2019-03-12 NOTE — Progress Notes (Unsigned)
Theodore May # [id number] Theodore May called stating he was not given the results of his CT scan  IMPRESSION: 1. No acute intracranial abnormalities. 2. 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 This is likely chronic, as similar findings were seen in 2010, however, clinical correlation for signs and symptoms of sinusitis is recommended.   Advise to purchase over the antihistamine and send in antibiotic for sinuusitis

## 2019-03-17 ENCOUNTER — Ambulatory Visit (INDEPENDENT_AMBULATORY_CARE_PROVIDER_SITE_OTHER): Payer: Self-pay | Admitting: Primary Care

## 2019-03-24 ENCOUNTER — Telehealth (INDEPENDENT_AMBULATORY_CARE_PROVIDER_SITE_OTHER): Payer: Self-pay | Admitting: Primary Care
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
## 2021-02-19 ENCOUNTER — Other Ambulatory Visit: Payer: Self-pay

## 2021-02-19 ENCOUNTER — Ambulatory Visit (INDEPENDENT_AMBULATORY_CARE_PROVIDER_SITE_OTHER): Payer: Self-pay | Admitting: Primary Care

## 2021-02-19 ENCOUNTER — Encounter (INDEPENDENT_AMBULATORY_CARE_PROVIDER_SITE_OTHER): Payer: Self-pay | Admitting: Primary Care

## 2021-02-19 VITALS — BP 130/84 | HR 97 | Temp 97.3°F | Ht 65.0 in | Wt 187.0 lb

## 2021-02-19 DIAGNOSIS — Z0001 Encounter for general adult medical examination with abnormal findings: Secondary | ICD-10-CM

## 2021-02-19 DIAGNOSIS — G44209 Tension-type headache, unspecified, not intractable: Secondary | ICD-10-CM

## 2021-02-19 DIAGNOSIS — E6609 Other obesity due to excess calories: Secondary | ICD-10-CM

## 2021-02-19 DIAGNOSIS — Z6831 Body mass index (BMI) 31.0-31.9, adult: Secondary | ICD-10-CM

## 2021-02-19 DIAGNOSIS — R21 Rash and other nonspecific skin eruption: Secondary | ICD-10-CM

## 2021-02-19 DIAGNOSIS — Z23 Encounter for immunization: Secondary | ICD-10-CM

## 2021-02-19 DIAGNOSIS — Z131 Encounter for screening for diabetes mellitus: Secondary | ICD-10-CM

## 2021-02-19 DIAGNOSIS — Z114 Encounter for screening for human immunodeficiency virus [HIV]: Secondary | ICD-10-CM

## 2021-02-19 DIAGNOSIS — R748 Abnormal levels of other serum enzymes: Secondary | ICD-10-CM

## 2021-02-19 DIAGNOSIS — R6889 Other general symptoms and signs: Secondary | ICD-10-CM

## 2021-02-19 LAB — POCT GLYCOSYLATED HEMOGLOBIN (HGB A1C): Hemoglobin A1C: 5.5 % (ref 4.0–5.6)

## 2021-02-19 MED ORDER — NYSTATIN-TRIAMCINOLONE 100000-0.1 UNIT/GM-% EX OINT: 1.0000 "application " | TOPICAL_OINTMENT | Freq: Two times a day (BID) | CUTANEOUS | 1 refills | Status: AC

## 2021-02-19 NOTE — Patient Instructions (Addendum)
Gripe en los adultos Influenza, Adult A la gripe tambin se la conoce como "influenza". Es una infeccin en los pulmones, la nariz y la garganta (vas respiratorias). Se transmite fcilmente de persona a persona (es contagiosa). La gripe causa sntomas que son como los de un resfro, junto con fiebre alta y dolores corporales. Cules son las causas? La causa de esta afeccin es el virus de la influenza. Puede contraer el virus de las siguientes maneras: Al inhalar gotitas que quedan en el aire despus de que una persona infectada con gripe tosi o estornud. Al tocar algo que est contaminado con el virus y luego llevarse la mano a la boca, la nariz o los ojos. Qu incrementa el riesgo? Hay ciertas cosas que lo pueden hacer ms propenso a tener gripe. Estas incluyen lo siguiente: No lavarse las manos con frecuencia. Tener contacto cercano con muchas personas durante la temporada de resfro y gripe. Tocarse la boca, los ojos o la nariz sin antes lavarse las manos. No recibir la vacuna antigripal todos los aos. Puede correr un mayor riesgo de tener gripe, y problemas graves, como una infeccin pulmonar (neumona), si usted: Es mayor de 65 aos de edad. Est embarazada. Tiene debilitado el sistema que combate las defensas (sistema inmunitario) debido a una enfermedad o a que toma determinados medicamentos. Tiene una afeccin a largo plazo (crnica), como las siguientes: Enfermedad cardaca, renal o pulmonar. Diabetes. Asma. Tiene un trastorno heptico. Tiene mucho sobrepeso (obesidad mrbida). Tiene anemia. Cules son los signos o sntomas? Los sntomas normalmente comienzan de repente y duran entre 4 y 14 das. Pueden incluir los siguientes: Fiebre y escalofros. Dolores de cabeza, dolores en el cuerpo o dolores musculares. Dolor de garganta. Tos. Secrecin o congestin nasal. Molestias en el pecho. No querer comer tanto como lo hace normalmente. Sensacin de debilidad o  cansancio. Mareos. Malestar estomacal o vmitos. Cmo se trata? Si la gripe se detecta de forma temprana, puede recibir tratamiento con medicamentos antivirales. Esto puede ayudar a reducir la gravedad y la duracin de la enfermedad. Se los administrarn por boca o a travs de un tubo (catter) intravenoso. Cuidarse en su hogar puede ayudar a que mejoren los sntomas. El mdico puede recomendarle lo siguiente: Tomar medicamentos de venta libre. Beber abundante lquido. La gripe suele desaparecer sola. Si tiene sntomas muy graves u otros problemas, puede recibir tratamiento en un hospital. Siga estas instrucciones en su casa:   Actividad Descanse todo lo que sea necesario. Duerma lo suficiente. Qudese en su casa y no concurra al trabajo o a la escuela, como se lo haya indicado el mdico. No salga de su casa hasta que no haya tenido fiebre por 24 horas sin tomar medicamentos. Salga de su casa solamente para ir al mdico. Comida y bebida Tome una SRO (solucin de rehidratacin oral). Es una bebida que se vende en farmacias y tiendas. Beba suficiente lquido como para mantener la orina de color amarillo plido. En la medida en que pueda, beba lquidos transparentes en pequeas cantidades. Los lquidos transparentes son, por ejemplo: Agua. Trocitos de hielo. Jugo de frutas mezclado con agua. Bebidas deportivas de bajas caloras. Coma alimentos suaves que sean fciles de digerir. En la medida que pueda, consuma pequeas cantidades. Estos alimentos incluyen: Bananas. Pur de manzana. Arroz. Carnes magras. Tostadas. Galletas. No coma ni beba lo siguiente: Lquidos con alto contenido de azcar o cafena. Alcohol. Alimentos condimentados o con alto contenido de grasa. Indicaciones generales Use los medicamentos de venta libre y los recetados solamente   como se lo haya indicado el mdico. Use un humidificador de aire fro para que el aire de su casa est ms hmedo. Esto puede facilitar  la respiracin. Cuando utilice un humidificador de vapor fro, lmpielo a diario. Vace el agua y [place] por agua limpia. Al toser o estornudar, cbrase la boca y la [place]. Lvese las manos frecuentemente con agua y [place] y durante al menos 20 segundos. Esto tambin es importante despus de toser o Engineering geologist. Si no dispone de [place] y [place], use desinfectante para manos con alcohol. Cumpla con todas las visitas de seguimiento. Cmo se previene?  Colquese la vacuna antigripal todos los [place]. Puede colocarse la vacuna contra la gripe a fines de verano, en otoo o en invierno. Pregntele al mdico cundo debe aplicarse la vacuna contra la gripe. Evite el contacto con personas que estn enfermas durante el otoo y el invierno. Es la temporada del resfro y Emergency planning/management officer. Comunquese con un mdico si: Tiene sntomas nuevos. Tiene los siguientes sntomas: Dolor de [place]. Materia fecal lquida (diarrea). Grant Ruts. La tos empeora. Empieza a tener ms mucosidad. Tiene Programme researcher, broadcasting/film/video. Vomita. Solicite ayuda de inmediato si: Le falta el aire. Tiene dificultad para respirar. La piel o las uas se ponen de un color azulado. Presenta dolor muy intenso o rigidez en el cuello. Tiene dolor de cabeza repentino. Le duele la cara o el odo de forma repentina. No puede comer ni beber sin vomitar. Estos sntomas pueden representar un problema grave que constituye Radio broadcast assistant. Solicite atencin mdica de inmediato. Comunquese con el servicio de emergencias de su localidad (911 en los [place]). No espere a ver si los sntomas desaparecen. No conduzca por sus propios medios [employer]. Resumen A la gripe tambin se la conoce como "influenza". Es una [employer] , la nariz y Administrator. Se transmite fcilmente de [place] persona a otra. Use los medicamentos de venta libre y los recetados solamente como se lo haya indicado el mdico. Aplicarse la vacuna contra la gripe todos los  aos es la mejor manera de no contagiarse la gripe. Esta informacin no tiene Theme park manager el consejo del mdico. Asegrese de hacerle al mdico cualquier pregunta que tenga. Document Revised: 03/02/2020 Document Reviewed: 03/02/2020 Elsevier Patient Education  2022 Elsevier Inc.  La candidiasis es causada por hongos en la piel o las membranas mucosas. Los sntomas de una candidiasis dependen de la parte del cuerpo en la que se [place]. Los sntomas comunes son sarpullido, secrecin blanca o picazn. Las infecciones por hongos se tratan con ungentos medicados u otras preparaciones contra los hongos (antimicticos).

## 2021-02-19 NOTE — Progress Notes (Signed)
[facility]   [name] is a 35 y.o. Hispanic (interpreter Melanee Spry [phone]) male presents to office today for annual physical exam examination.     Concerns today include: Past 2 weeks dizzy and pain in forehead and the back of his neck. Pimples behind neck and arms left side only, chill bumps bilateral  wrist and feels cold. (Right wrist positive for Tinel test).   Occupation: roofing , Marital status: M, Substance use: no Diet: fair diet , Exercise: yes Last eye exam: no Problems driving at night Last dental exam: no Refills needed today: non Immunizations needed: Flu Vaccine: yes  Tdap Vaccine: yes 10/29/2017 - every 14yrs - (<3 lifetime doses or unknown): all wounds -- look up need for Tetanus IG - (>=3 lifetime doses): clean/minor wound if >99yrs from previous; all other wounds if >87yrs from previous  Past Medical History:  Diagnosis Date   Atopic dermatitis    Social History   Socioeconomic History   Marital status: Married    Spouse name: Not on file   Number of children: Not on file   Years of education: Not on file   Highest education level: Not on file  Occupational History   Not on file  Tobacco Use   Smoking status: Never   Smokeless tobacco: Never  Vaping Use   Vaping Use: Never used  Substance and Sexual Activity   Alcohol use: Never   Drug use: Never   Sexual activity: Yes    Partners: Female  Other Topics Concern   Not on file  Social History Narrative   Not on file   Social Determinants of Health   Financial Resource Strain: Not on file  Food Insecurity: Not on file  Transportation Needs: Not on file  Physical Activity: Not on file  Stress: Not on file  Social Connections: Not on file  Intimate Partner Violence: Not on file   Past Surgical History:  Procedure Laterality Date   APPENDECTOMY     Family History  Problem Relation Age of Onset   Diabetes Mother    Diabetes Father    No current outpatient  medications on file.  No Known Allergies   ROS: Review of Systems Pertinent items noted in HPI and remainder of comprehensive ROS otherwise negative.    Physical exam BP 130/84 (BP Location: Right Arm, Patient Position: Sitting, Cuff Size: Normal)   Pulse 97   Temp (!) 97.3 F (36.3 C) (Temporal)   Ht 5\' 5"  (1.651 m)   Wt 187 lb (84.8 kg)   SpO2 95%   BMI 31.12 kg/m   General: No apparent distress. Obese male  Eyes: Extraocular eye movements intact, pupils equal and round. Neck: Supple, trachea midline. Thyroid: No enlargement, mobile without fixation, no tenderness. Cardiovascular: Regular rhythm and rate, no murmur, normal radial pulses. Respiratory: Normal respiratory effort, clear to auscultation. Gastrointestinal: Normal pitch active bowel sounds, nontender abdomen without distention or appreciable hepatomegaly. Neurologic:  no tremor,  Musculoskeletal: Normal muscle tone, no tenderness on palpation of tibia, no excessive thoracic kyphosis. Skin: Appropriate warmth, left side of of neck and armpit rash. Mental status: Alert, conversant, speech clear, thought logical, appropriate mood and affect, no hallucinations or delusions evident. Hematologic/lymphatic: No cervical adenopathy, no visible ecchymoses.   Assessment/ Plan: here for annual physical exam.  Diagnoses and all orders for this visit:  Screening for diabetes mellitus -     HgB A1c 5.4  Need for immunization against influenza -  Flu Vaccine QUAD 30mo+IM (Fluarix, Fluzone & Alfiuria Quad PF)  Tension-type headache, not intractable, unspecified chronicity pattern Hx of TBI -  Triggers can be caused by drinking alcohol smoking or certain medications, eating and drinking certain products. This condition may be triggered or caused by: Caffeine, aged cheese, and chocolate. (Trying to only drink beer on weekend) Does drink a lot of coffee -3-4 cups  and pepsi   Flu vaccine need  Completed    Class 1 obesity due to excess calories without serious comorbidity with body mass index (BMI) of 31.0 to 31.9 in adult Obesity is 30-39 indicating an excess in caloric intake or underlining conditions. This may lead to other co-morbidities. Lifestyle modifications of diet and exercise may reduce obesity.    Cold intolerance of hand Chill bumps from elbow to hands   Rash and nonspecific skin eruption nystatin-triamcinolone ointment  Counseled on healthy lifestyle choices, including diet (rich in fruits, vegetables and lean meats and low in salt and simple carbohydrates) and exercise (at least 30 minutes of moderate physical activity daily).  Patient to follow up in 1 year for annual exam or sooner if needed.  The above assessment and management plan was discussed with the patient. The patient verbalized understanding of and has agreed to the management plan. Patient is aware to call the clinic if symptoms persist or worsen. Patient is aware when to return to the clinic for a follow-up visit. Patient educated on when it is appropriate to go to the emergency department.   Gwinda Passe NP-C [address] [place] [address] [phone]

## 2021-02-20 LAB — CMP14+EGFR
ALT: 139 IU/L — ABNORMAL HIGH (ref 0–44)
AST: 56 IU/L — ABNORMAL HIGH (ref 0–40)
Albumin/Globulin Ratio: 1.9 (ref 1.2–2.2)
Albumin: 4.6 g/dL (ref 4.0–5.0)
Alkaline Phosphatase: 80 IU/L (ref 44–121)
BUN/Creatinine Ratio: 18 (ref 9–20)
BUN: 11 mg/dL (ref 6–20)
Bilirubin Total: 0.5 mg/dL (ref 0.0–1.2)
CO2: 25 mmol/L (ref 20–29)
Calcium: 9.8 mg/dL (ref 8.7–10.2)
Chloride: 99 mmol/L (ref 96–106)
Creatinine, Ser: 0.61 mg/dL — ABNORMAL LOW (ref 0.76–1.27)
Globulin, Total: 2.4 g/dL (ref 1.5–4.5)
Glucose: 159 mg/dL — ABNORMAL HIGH (ref 70–99)
Potassium: 4.2 mmol/L (ref 3.5–5.2)
Sodium: 139 mmol/L (ref 134–144)
Total Protein: 7 g/dL (ref 6.0–8.5)
eGFR: 129 mL/min/{1.73_m2} (ref >59)

## 2021-02-20 LAB — CBC WITH DIFFERENTIAL/PLATELET
Basophils Absolute: 0.1 10*3/uL (ref 0.0–0.2)
Basos: 1 %
EOS (ABSOLUTE): 0.2 10*3/uL (ref 0.0–0.4)
Eos: 3 %
Hematocrit: 51.3 % — ABNORMAL HIGH (ref 37.5–51.0)
Hemoglobin: 17.8 g/dL — ABNORMAL HIGH (ref 13.0–17.7)
Immature Grans (Abs): 0 10*3/uL (ref 0.0–0.1)
Immature Granulocytes: 0 %
Lymphocytes Absolute: 2.2 10*3/uL (ref 0.7–3.1)
Lymphs: 31 %
MCH: 30.4 pg (ref 26.6–33.0)
MCHC: 34.7 g/dL (ref 31.5–35.7)
MCV: 88 fL (ref 79–97)
Monocytes Absolute: 0.5 10*3/uL (ref 0.1–0.9)
Monocytes: 8 %
Neutrophils Absolute: 4.1 10*3/uL (ref 1.4–7.0)
Neutrophils: 57 %
Platelets: 209 10*3/uL (ref 150–450)
RBC: 5.85 x10E6/uL — ABNORMAL HIGH (ref 4.14–5.80)
RDW: 13.4 % (ref 11.6–15.4)
WBC: 7.2 10*3/uL (ref 3.4–10.8)

## 2021-02-20 LAB — HIV ANTIBODY (ROUTINE TESTING W REFLEX): HIV Screen 4th Generation wRfx: NONREACTIVE

## 2021-02-28 ENCOUNTER — Telehealth (INDEPENDENT_AMBULATORY_CARE_PROVIDER_SITE_OTHER): Payer: Self-pay

## 2021-02-28 NOTE — Telephone Encounter (Signed)
Copied from CRM [id number]. Topic: General - Other >> Feb 28, 2021  2:49 PM Gwenlyn Fudge wrote: Reason for CRM: Pt called and is requesting to have a nurse give a call back to have someone to discuss lab results with. Please advise.   Please address patients labs from 02/19/21. Maryjean Morn, CMA

## 2021-03-06 ENCOUNTER — Telehealth: Payer: Self-pay

## 2021-03-06 NOTE — Telephone Encounter (Signed)
Please place referral

## 2021-03-06 NOTE — Telephone Encounter (Signed)
Using Spanish interpreter # [phone] given lab results, verbalizes understanding. Also requests a referral to the eye doctor. Please advise.

## 2021-03-09 NOTE — Telephone Encounter (Signed)
Called patient with Theodore May [phone] to address concerns with labs . Unable to each unable to leave message vm not set up

## 2021-03-09 NOTE — Telephone Encounter (Signed)
Called patient reviewed labs admitted to taking a lot Tylenol for pain but has stop. Requested referral for eye exam inform patient with no insurance he can go anywhere the visit would be out of pocket
